# Patient Record
Sex: Female | Born: 1990 | Race: White | Hispanic: No | Marital: Single | Smoking: Never smoker
Health system: Western US, Academic
[De-identification: ages and names within clinical notes are randomized; demographics above are authoritative.]

## PROBLEM LIST (undated history)

## (undated) DIAGNOSIS — M419 Scoliosis, unspecified: Secondary | ICD-10-CM

## (undated) HISTORY — DX: Scoliosis, unspecified: M41.9

## (undated) MED ORDER — MODAFINIL 100 MG OR TABS
200.00 mg | ORAL_TABLET | Freq: Every evening | ORAL | Status: AC
Start: 2015-12-14 — End: ?

## (undated) MED ORDER — MODAFINIL 200 MG OR TABS
200.00 mg | ORAL_TABLET | Freq: Every morning | ORAL | 3 refills | Status: AC
Start: 2015-12-18 — End: ?

---

## 2012-07-06 IMAGING — CT CT HEAD WITHOUT CONTRAST
2 series · 16 of 30 positions shown, 20 images · non-contrast
Comparison: none

REASON FOR EXAM: headache, NV s/p fall resulting in head injury
COMMENTS:

[Series 2: without · axial · non-contrast · 0.42mm/px · z∈[-179,-59]mm · 13 of 29 slices shown, 17 images]
[im 3/29  brain]
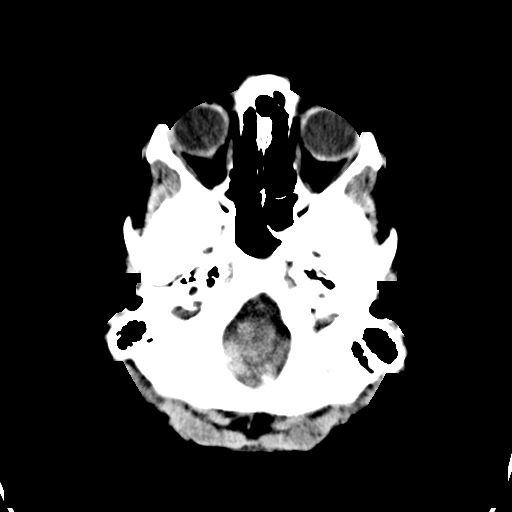
[im 3/29  bone]
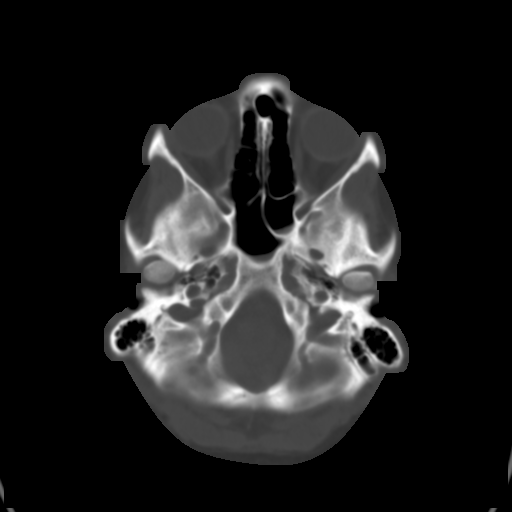
[im 5/29  brain]
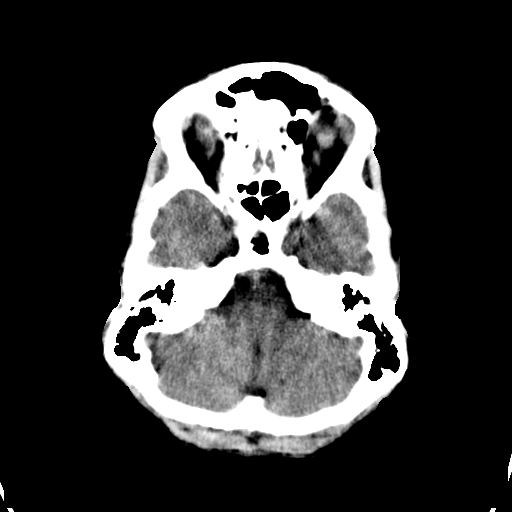
[im 7/29  brain]
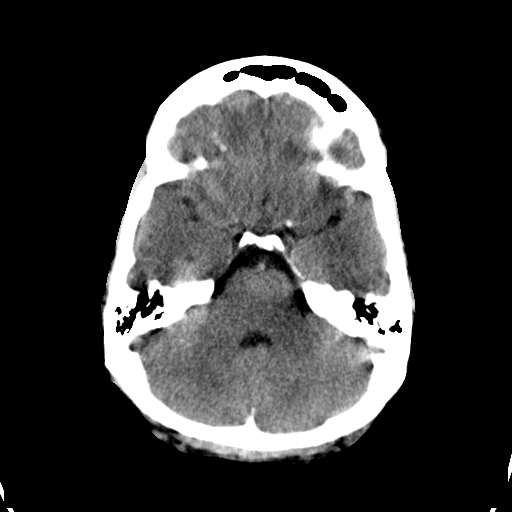
[im 9/29  brain]
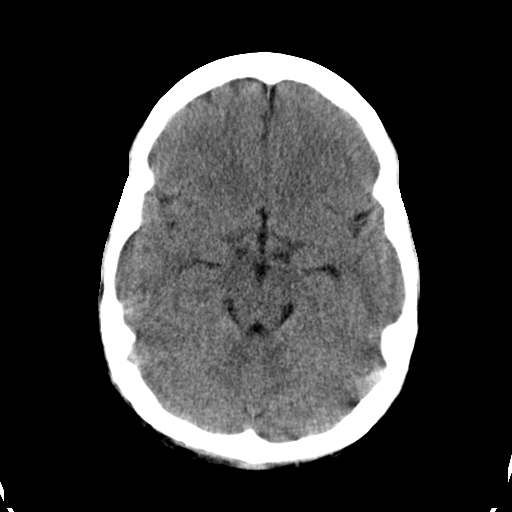
[im 11/29  brain]
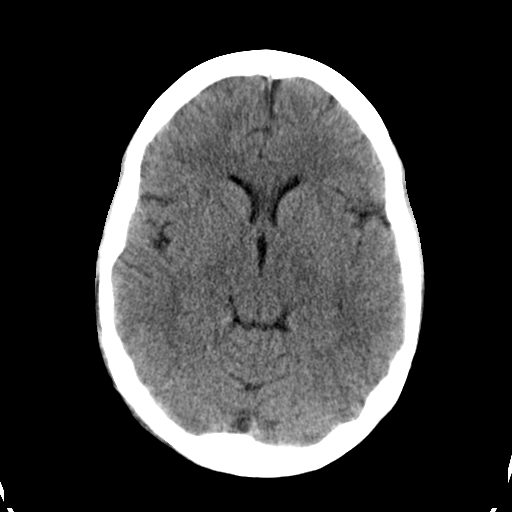
[im 11/29  bone]
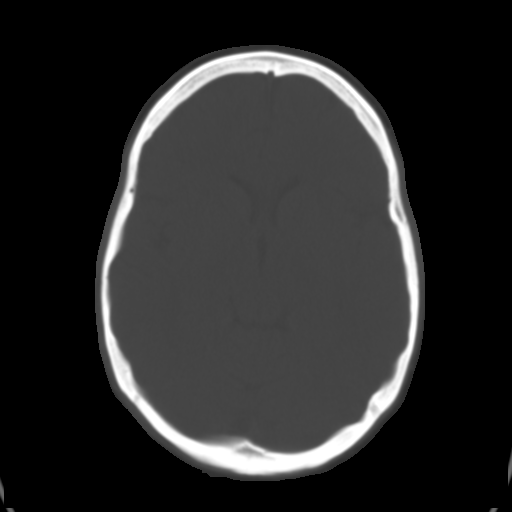
[im 13/29  brain]
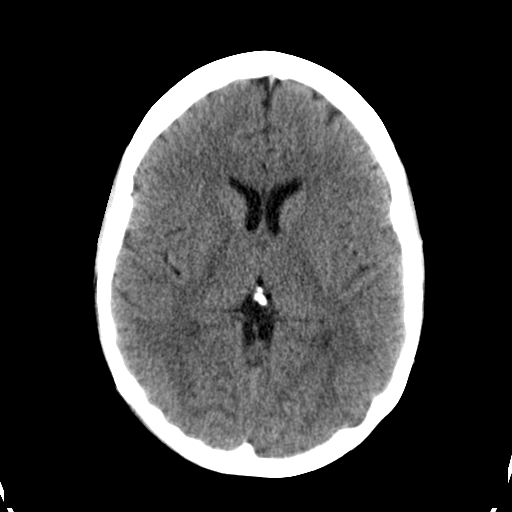
[im 15/29  brain]
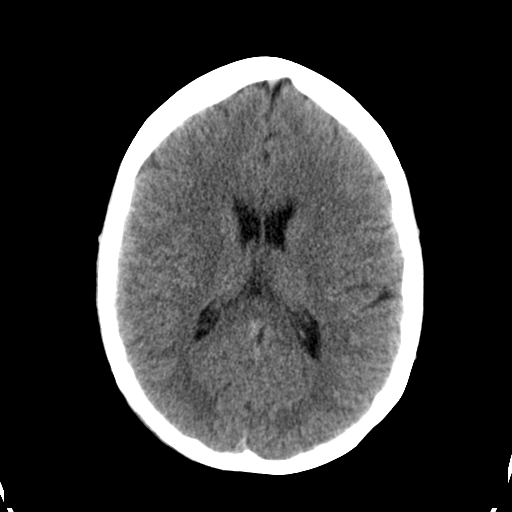
[im 17/29  brain]
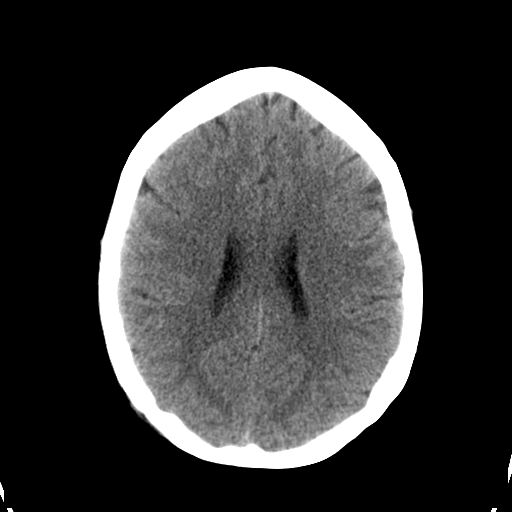
[im 19/29  brain]
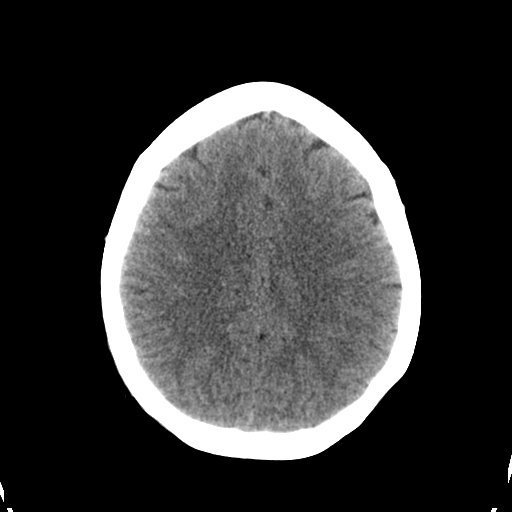
[im 19/29  bone]
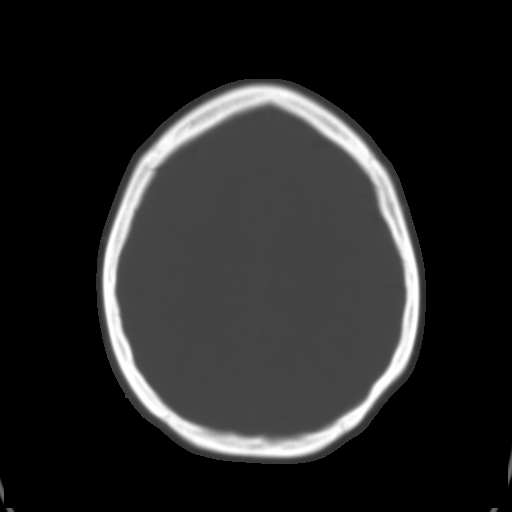
[im 21/29  brain]
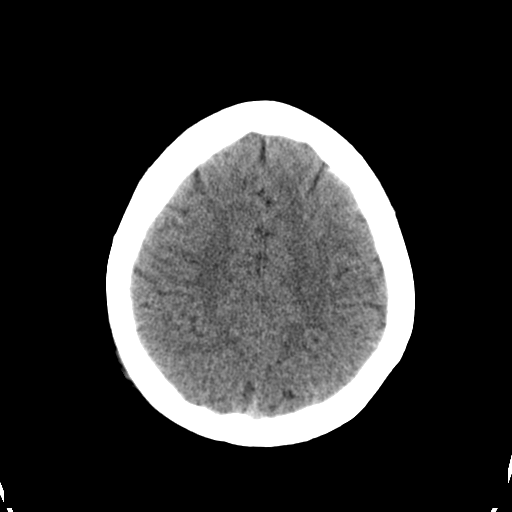
[im 23/29  brain]
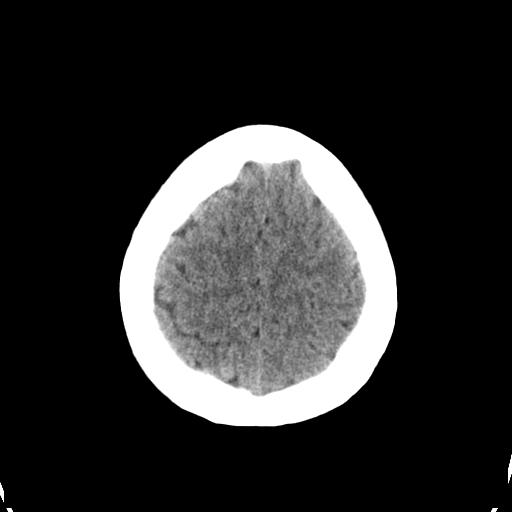
[im 25/29  brain]
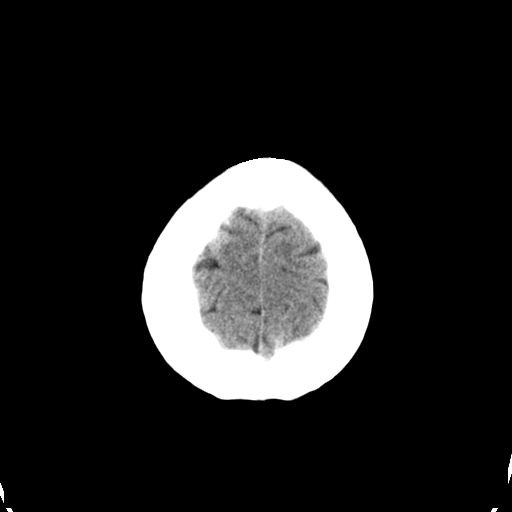
[im 27/29  brain]
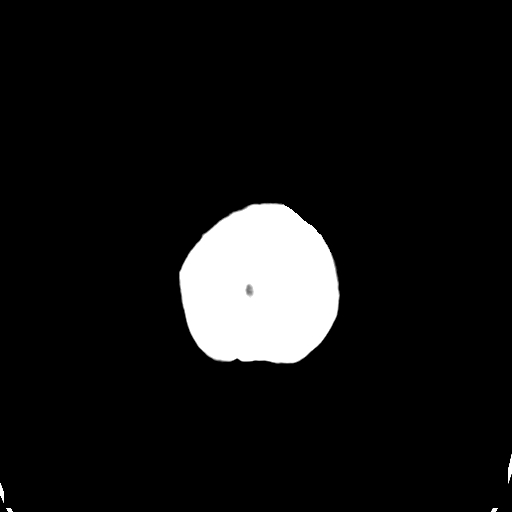
[im 27/29  bone]
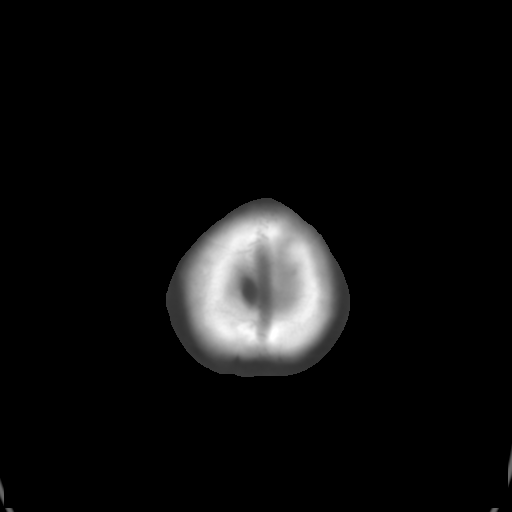

[Series 3: bone · axial · 0.42mm/px · z∈[-179,-139]mm · 3 of 29 slices shown]
[im 3/29  bone]
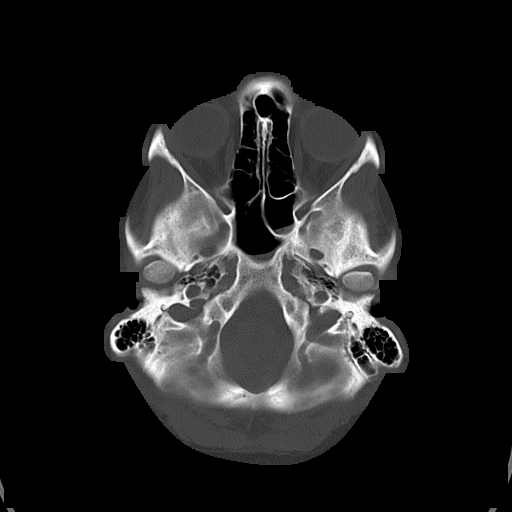
[im 7/29  bone]
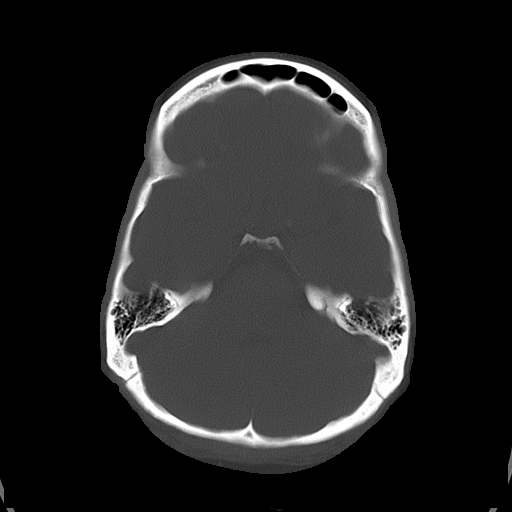
[im 11/29  bone]
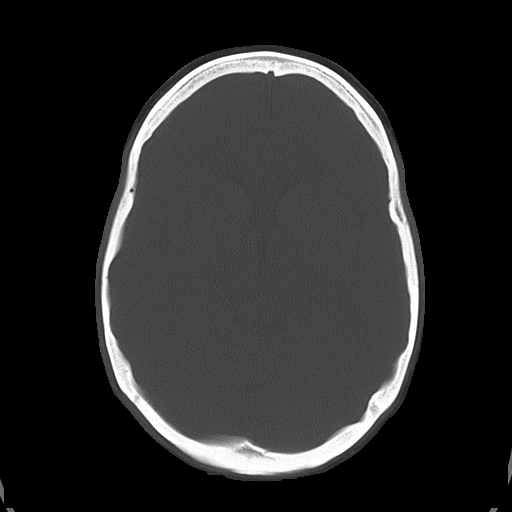

[16 of 30 positions shown; findings below may reference images not displayed]

PROCEDURE:     CT  - CT HEAD WITHOUT CONTRAST  - April 18, 2011  [DATE]

RESULT:     Emergent noncontrast CT of the brain is performed. The patient
has no previous exam for comparison.

The ventricles and sulci are normal. There is no hemorrhage. There is no
focal mass, mass-effect or midline shift. There is no evidence of edema or
territorial infarct. The bone windows demonstrate normal aeration of the
paranasal sinuses and mastoid air cells with the exception of minimal
air-fluid levels in the sphenoid sinuses. There is no skull fracture
demonstrated.
IMPRESSION: 1. No acute intracranial abnormality.
2. Minimal air-fluid levels demonstrated in the sphenoid sinuses. Correlate
for evidence of acute sinus disease. Occult fracture is felt to be less
likely.

## 2015-12-04 ENCOUNTER — Encounter (INDEPENDENT_AMBULATORY_CARE_PROVIDER_SITE_OTHER): Payer: Self-pay | Admitting: Critical Care Medicine

## 2015-12-04 ENCOUNTER — Encounter: Payer: Self-pay | Admitting: Hospital

## 2015-12-04 ENCOUNTER — Ambulatory Visit (INDEPENDENT_AMBULATORY_CARE_PROVIDER_SITE_OTHER): Payer: BC Managed Care – PPO | Admitting: Critical Care Medicine

## 2015-12-04 VITALS — BP 128/82 | HR 77 | Temp 98.4°F | Resp 15 | Ht 62.0 in | Wt 135.0 lb

## 2015-12-04 DIAGNOSIS — G4711 Idiopathic hypersomnia with long sleep time: Secondary | ICD-10-CM

## 2015-12-04 MED ORDER — MODAFINIL 200 MG OR TABS
200.00 mg | ORAL_TABLET | Freq: Every morning | ORAL | Status: DC
Start: ? — End: 2016-06-02

## 2015-12-04 MED ORDER — MODAFINIL 100 MG OR TABS
200.00 mg | ORAL_TABLET | Freq: Every evening | ORAL | Status: DC
Start: ? — End: 2017-01-17

## 2015-12-04 MED ORDER — CETIRIZINE HCL 10 MG OR TABS: 10.00 mg | ORAL_TABLET | Freq: Every day | ORAL | Status: AC

## 2015-12-04 NOTE — Progress Notes (Signed)
Sleep Medicine Clinic Initial Patient Visit    History of present illness:  25 year old female presenting for .  Referred [id number] referred.  This is a 25 years old female who comes in today for evaluation for narcolepsy. 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 The patient stated that she was started on Provigil and felt much better. The patient denies now any problem with day time sleepiness.   The patient goes to bed at 10 and gets up at 7, she has no problem falling asleep. feeling rested. The patient was getting tired around 10 am where she used to experience a lot of sleepiness. The patient used to drink at least 5-6 cups a day and now down to 3 cups. She doesn't have any signs or symptoms of cataplexy. She denies any hypnagogic or hypnopompic hallucination. She had never experienced any weakness when gets excited.     Epworth score is Epworth Total Score: 10 out of 24.  Feels sleepy during the day:     Bedroom is comfortable, safe, good temperature.      Coffee: 3 cup.  Tea/Sodas: .  No caffeine tablets, energy drinks, etc.      Sleep review of systems:  Bed partner: yes  No snoring.  No clear witnessed apneas.  No choking/gasping for air.  No morning headaches.  + dry mouth.  No myoclonic jerks.  No bruxism.  No hallucinations.  No vivid dreams or nightmares.  No sleep paralysis.  No cataplexy.  No sleepwalking.  No sleeptalking.  No restless legs symptoms.  No significant weight change.  No sheets in disarray at the end of the night.    General review of systems:  - General: No fevers, chills, night sweats.  - HEENT: No headaches, eye pain or discharge, runny nose, sore throat, hearing loss or ear pain.  - Neck: No swelling or tenderness.  - CV: No palpitations, chest pain/pressure, or fainting.  - Pulm: No cough or shortness of breath.  - Abd: No abdominal swelling or pain, no nausea, vomiting, diarrhea or  constipation.  - Ext: No leg edema or tenderness.    Past Medical History:   Diagnosis Date   . Scoliosis      No past surgical history on file.  Family History   Problem Relation Age of Onset   . Cancer Neg Hx    . Diabetes Neg Hx    . Heart Disease Neg Hx    . Hypertension Neg Hx    . Stroke Neg Hx    . Thyroid Neg Hx      Social History   Substance Use Topics   . Smoking status: Never Smoker   . Smokeless tobacco: Never Used   . Alcohol use 10.0 oz/week     2 Glasses of wine per week       Vitals:  BP 128/82 (BP Location: Left arm, BP Patient Position: Sitting, BP cuff size: Regular)  Pulse 77  Temp 98.4 F (36.9 C) (Oral)  Resp 15  Ht '5\' 2"'  (1.575 m)  Wt 61.2 kg (135 lb)  SpO2 99%  BMI 24.69 kg/m2    Exam:  - Gen: Well-appearing, no distress.  - HEENT:   - General: Benign oropharynx without exudate or erythema.   - Airway/tonsils: Mallampati II airway. Normal tonsils.   - Pharyngeal structure: No evidence of lateral pharyngeal wall  mucosa hypertrophy. No evidence of high arched palate. Uvula is not enlarged or erythematous. Posterior oropharynx is not shallow in the A/P diameter.   - Tongue: No macroglossia.   - Chin: No micro/retrognathia.   - Nasal structure: No deviated septum.  - Neck: No thyromegaly. Grossly normal.  - CV: Regular rate and rhythm, normal S1/S2, no m/g/r.  - Pulm: Clear breath sounds bilaterally. No crackles or wheezes. Nonlabored breathing.  - Abd: Soft, nontender, nondistended. Normal bowel sounds throughout.  - Ext: No clubbing or edema.   - Skin: No rashes, lesions, or ulcers.  - Neuro: Alert, fully oriented. Normal mood and affect.    Accessory clinical data:  Labs:   - CBC:  No results found for: WBC, HGB, HCT, PLT   - Chemistries:  No results found for: NA, K, CL, BICARB, BUN, CREAT, GLU, [place]   - Coagulation studies:  No results found for: PT, PTT, INR   - LFTs:  No results found for: AST, ALT, GGT, LDH, ALK, TP, ALB, TBILI, DBILI  Assessment/Plan:  Narcolepsy:  I doubt the  diagnosis of Narcolepsy and favors the diagnosis of idiopathic hypersomnia. The patient over night PSG was normal and the MSLT showed 2 REM sleep onset but the mean sleep latency was 13 min. The patient has been doing well on Provigil and it did make a difference in her overall daily function. I think at this point no further testing is needed.    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

## 2015-12-14 ENCOUNTER — Other Ambulatory Visit (INDEPENDENT_AMBULATORY_CARE_PROVIDER_SITE_OTHER): Payer: Self-pay | Admitting: Critical Care Medicine

## 2015-12-14 DIAGNOSIS — G4711 Idiopathic hypersomnia with long sleep time: Secondary | ICD-10-CM

## 2015-12-14 NOTE — Telephone Encounter (Signed)
Patient is requesting a refill.    Provider Name:Dr Haddadin    Last Visit Date: 10.13    Next office visit date:     Medications:    modafinil (PROVIGIL) 100 MG tablet  modafinil (PROVIGIL) 200 MG tablet        Name of Pharmacy Updated in Demographics (YES/NO)    [facility]    [address], [place]  [phone]

## 2015-12-15 ENCOUNTER — Other Ambulatory Visit (INDEPENDENT_AMBULATORY_CARE_PROVIDER_SITE_OTHER): Payer: Self-pay | Admitting: Sleep Medicine

## 2015-12-18 NOTE — Telephone Encounter (Signed)
Done. Left VM on pt's phone to notify her Medication refill sent to pharmacy

## 2016-06-02 ENCOUNTER — Other Ambulatory Visit (INDEPENDENT_AMBULATORY_CARE_PROVIDER_SITE_OTHER): Payer: Self-pay | Admitting: Critical Care Medicine

## 2016-06-02 DIAGNOSIS — G4711 Idiopathic hypersomnia with long sleep time: Secondary | ICD-10-CM

## 2016-06-02 NOTE — Telephone Encounter (Signed)
Pt requesting med refill for Provigil   Last office visit:  12/04/2015  Last Med Fill: 12/04/2015  No future office visit's on file

## 2016-06-04 MED ORDER — MODAFINIL 200 MG OR TABS
200.0000 mg | ORAL_TABLET | Freq: Every morning | ORAL | 3 refills | Status: DC
Start: 2016-06-04 — End: 2017-01-10

## 2016-06-07 ENCOUNTER — Telehealth (INDEPENDENT_AMBULATORY_CARE_PROVIDER_SITE_OTHER): Payer: Self-pay | Admitting: Critical Care Medicine

## 2016-06-07 NOTE — Telephone Encounter (Signed)
RX called into [facility] pharmacy    modafinil (PROVIGIL) 200 MG tablet [[id number]]   Order Details   Dose: 200 mg Route: Oral Frequency: EVERY MORNING   Dispense Quantity:  30 tablet Refills:  3 Fills Remaining:  --          Sig: Take 1 tablet (200 mg) by mouth every morning.         Written Date:  06/04/16 Expiration Date:  --     Start Date:  06/04/16 End Date:  --           Ordering Provider:  Loyal Jacobson, MD DEA #:  [id number] NPI:  [id number]

## 2017-01-10 ENCOUNTER — Other Ambulatory Visit (INDEPENDENT_AMBULATORY_CARE_PROVIDER_SITE_OTHER): Payer: Self-pay | Admitting: Critical Care Medicine

## 2017-01-10 DIAGNOSIS — G4711 Idiopathic hypersomnia with long sleep time: Principal | ICD-10-CM

## 2017-01-10 NOTE — Telephone Encounter (Signed)
[facility] requesting refill on pt's Modafinil 200mg . Pt last seen in clinic on 12/04/15.

## 2017-01-16 NOTE — Telephone Encounter (Signed)
[facility]

## 2017-01-16 NOTE — Telephone Encounter (Signed)
[facility] is calling to follow up on modafinil (PROVIGIL) 200 MG tablet (Order# [id number])   Please assist. Thank you

## 2017-01-17 MED ORDER — MODAFINIL 200 MG OR TABS
200.0000 mg | ORAL_TABLET | Freq: Every morning | ORAL | 3 refills | Status: DC
Start: 2017-01-17 — End: 2017-01-19

## 2017-01-19 ENCOUNTER — Other Ambulatory Visit (INDEPENDENT_AMBULATORY_CARE_PROVIDER_SITE_OTHER): Payer: Self-pay | Admitting: Critical Care Medicine

## 2017-01-19 DIAGNOSIS — G4711 Idiopathic hypersomnia with long sleep time: Principal | ICD-10-CM

## 2017-01-19 NOTE — Telephone Encounter (Signed)
[facility] Pharmacist said they cannot transfer refills. Please re order.

## 2017-01-19 NOTE — Telephone Encounter (Signed)
[facility] pharmacy is calling in regards to modafinil (PROVIGIL) 200 MG tablet. Prescription was sent to the incorrect pharmacy. Requesting for rx to be sent to the correct [facility] below.      Thank you         [facility] [phone]#[id number] [facility]ra [facility]- [place], [place] - [address] AT [facility] [address]     [address] [place] [id number]     Phone: [phone] Fax: [phone]

## 2017-01-21 MED ORDER — MODAFINIL 200 MG OR TABS
200.0000 mg | ORAL_TABLET | Freq: Every morning | ORAL | 3 refills | Status: AC
Start: 2017-01-21 — End: ?
